# Patient Record
Sex: Female | Born: 1950 | Race: White | Hispanic: No | Marital: Single | Smoking: Current every day smoker
Health system: Southern US, Community
[De-identification: ages and names within clinical notes are randomized; demographics above are authoritative.]

## PROBLEM LIST (undated history)

## (undated) DIAGNOSIS — M1990 Unspecified osteoarthritis, unspecified site: Secondary | ICD-10-CM

## (undated) DIAGNOSIS — E079 Disorder of thyroid, unspecified: Secondary | ICD-10-CM

## (undated) DIAGNOSIS — T7840XA Allergy, unspecified, initial encounter: Secondary | ICD-10-CM

## (undated) DIAGNOSIS — I10 Essential (primary) hypertension: Secondary | ICD-10-CM

## (undated) DIAGNOSIS — G4730 Sleep apnea, unspecified: Secondary | ICD-10-CM

## (undated) HISTORY — DX: Unspecified osteoarthritis, unspecified site: M19.90

## (undated) HISTORY — DX: Sleep apnea, unspecified: G47.30

## (undated) HISTORY — DX: Allergy, unspecified, initial encounter: T78.40XA

## (undated) HISTORY — DX: Disorder of thyroid, unspecified: E07.9

## (undated) HISTORY — PX: DENTAL SURGERY: SHX609

---

## 2005-01-28 ENCOUNTER — Emergency Department: Payer: Self-pay | Admitting: Emergency Medicine

## 2005-03-11 ENCOUNTER — Ambulatory Visit: Payer: Self-pay | Admitting: Orthopaedic Surgery

## 2005-04-29 ENCOUNTER — Ambulatory Visit: Payer: Self-pay | Admitting: Internal Medicine

## 2006-01-08 ENCOUNTER — Ambulatory Visit: Payer: Self-pay | Admitting: Internal Medicine

## 2007-07-21 IMAGING — US US RENAL KIDNEY
1 series · 17 of 20 positions shown · non-contrast
Comparison: none

REASON FOR EXAM: hematuria
COMMENTS:

[Series 1: us renal kidney · 17 of 20 slices shown]
[im 1/20]
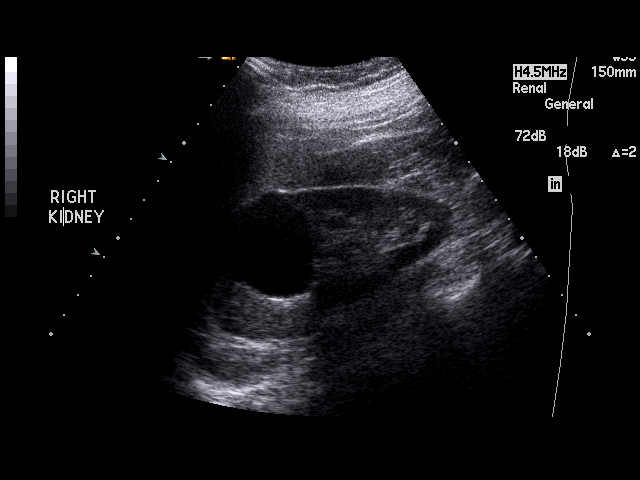
[im 2/20]
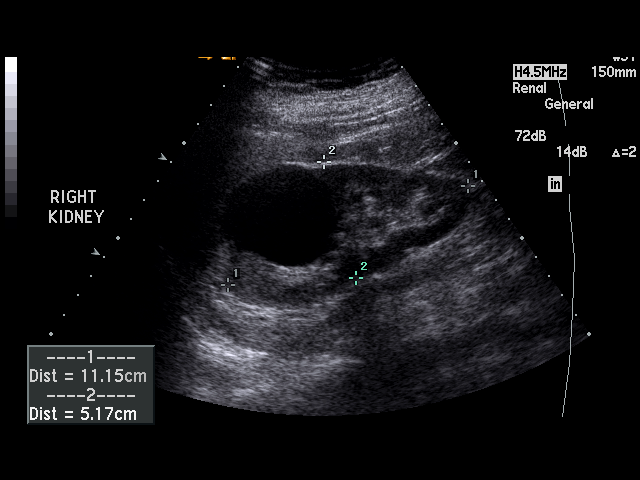
[im 3/20]
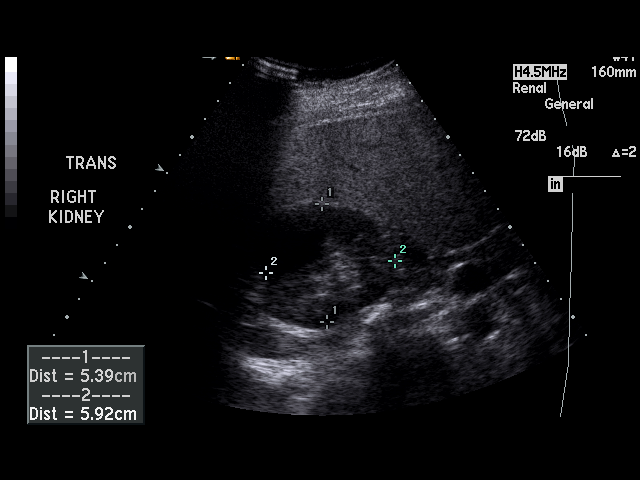
[im 5/20]
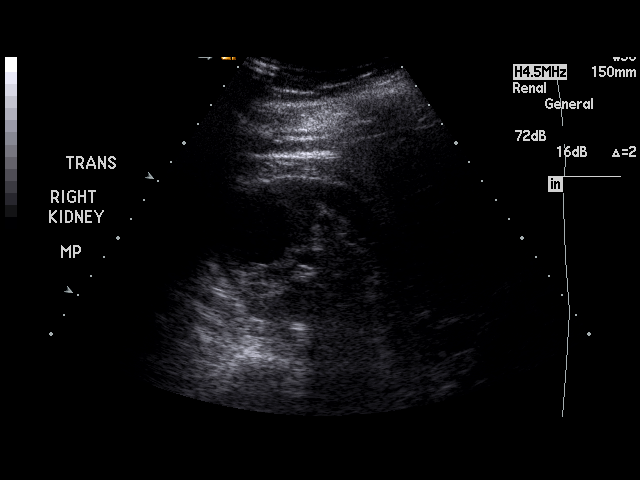
[im 6/20]
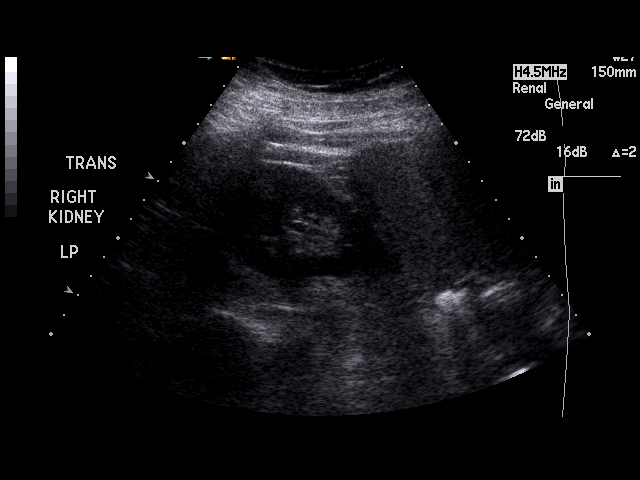
[im 7/20]
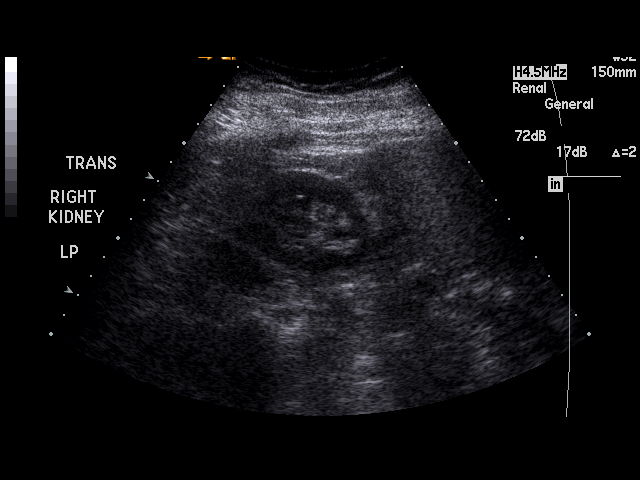
[im 8/20]
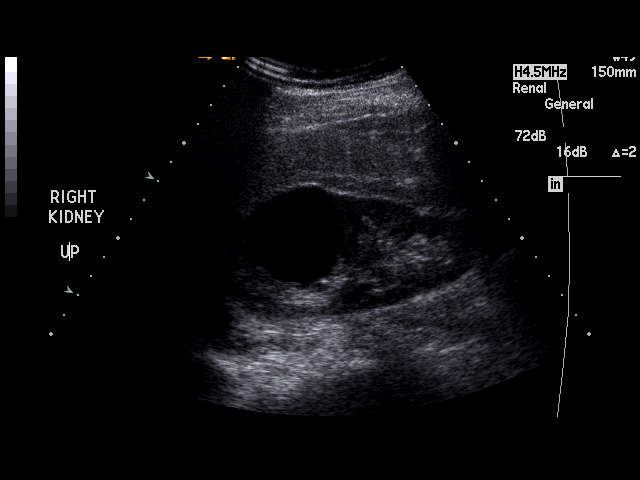
[im 9/20]
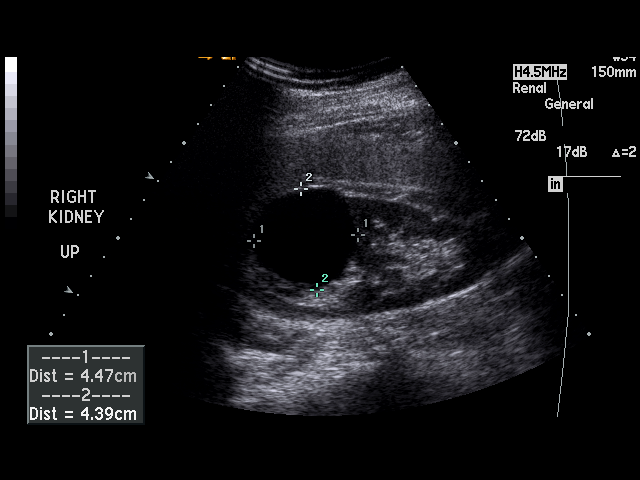
[im 11/20]
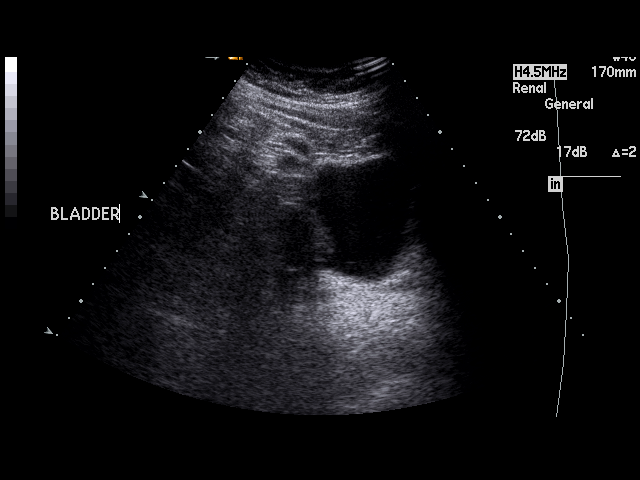
[im 12/20]
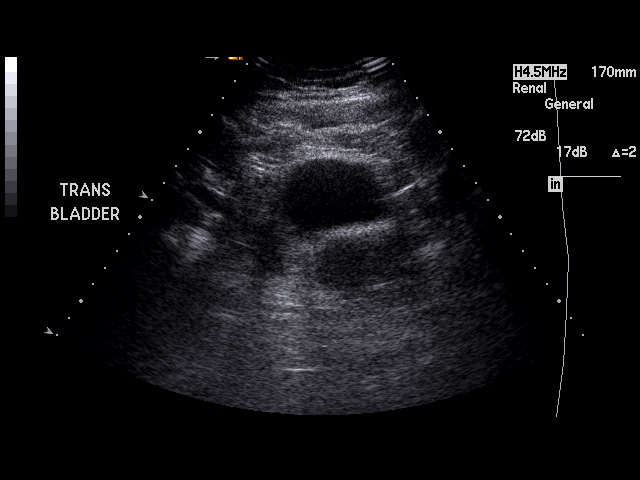
[im 13/20]
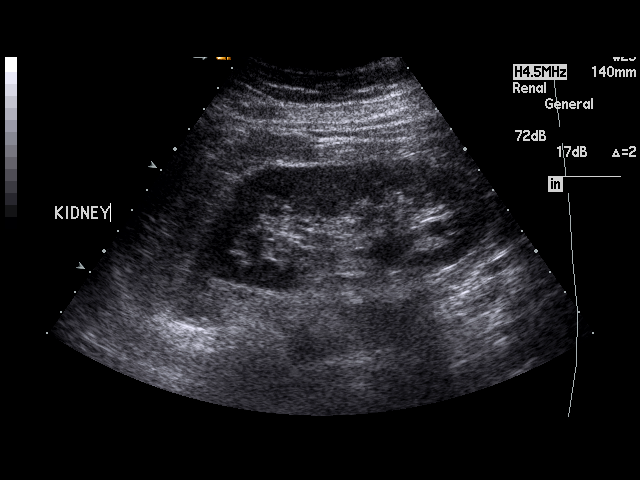
[im 14/20]
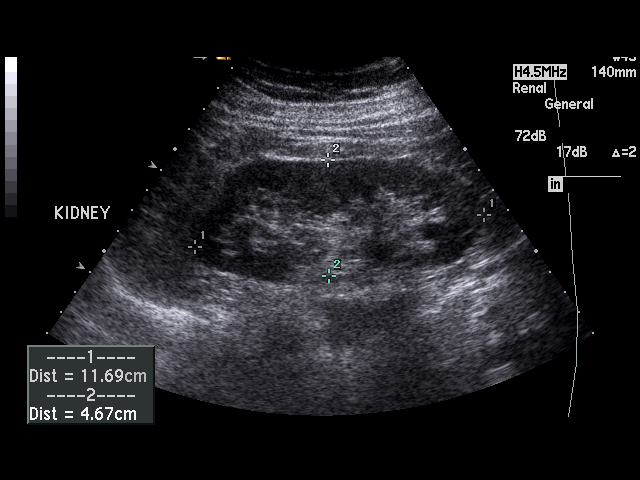
[im 15/20]
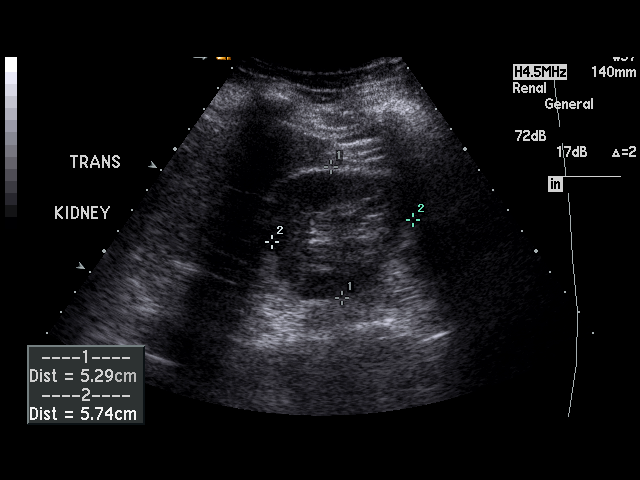
[im 16/20]
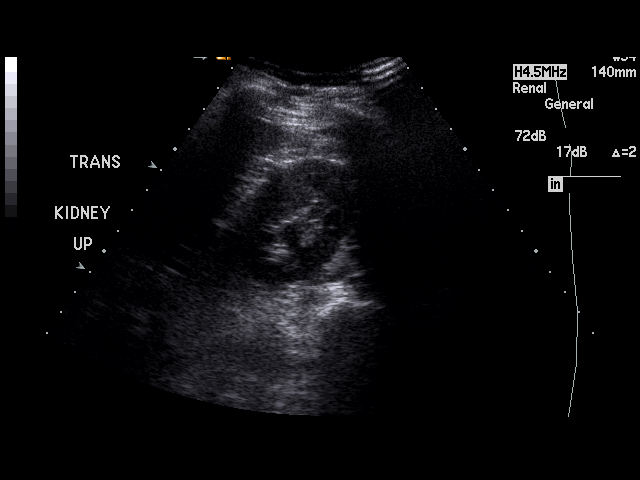
[im 18/20]
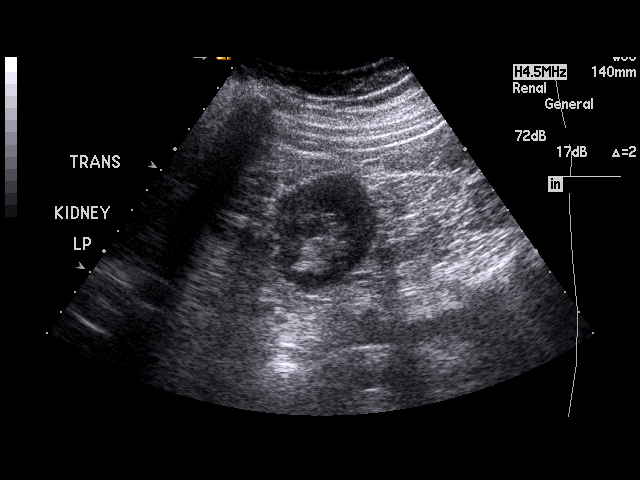
[im 19/20]
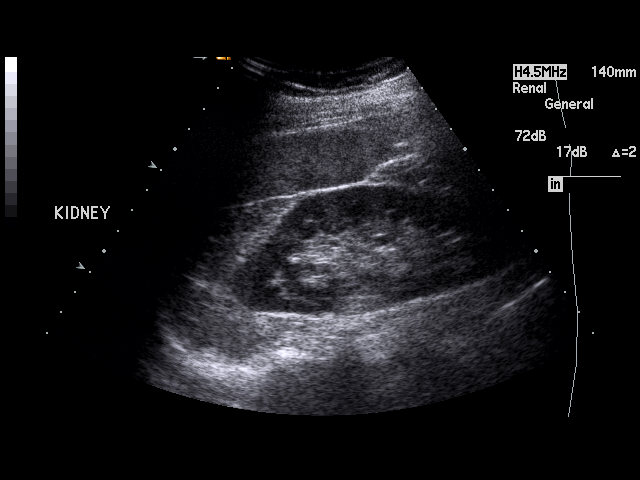
[im 20/20]
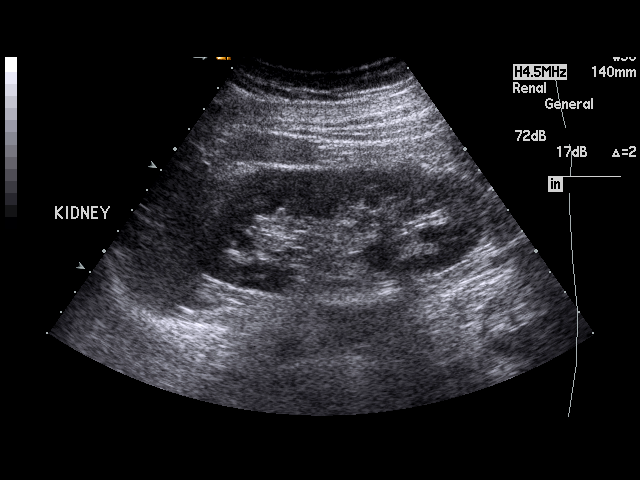

[17 of 20 positions shown; findings below may reference images not displayed]

PROCEDURE:     US  - US KIDNEY BILATERAL  - January 08, 2006  [DATE]

RESULT:     Sonographic evaluation of the kidneys reveals the RIGHT kidney
measures 11.2 x 5.4 x 5.9 cm and the LEFT 11.7 x 5.3 x 5.7 cm.  There is a
cyst in the upper pole of the RIGHT kidney measuring 4.5 x 5.1 x 4.8 cm.  No
hydronephrosis or definite soft tissue mass is seen. The cyst mentioned
above appears to be a simple cyst with no evidence of septation.  There is a
small amount of urine in the bladder.
IMPRESSION: 1)Upper pole RIGHT renal cyst.  Otherwise, unremarkable renal ultrasound.

## 2007-07-21 IMAGING — CR DG IVP HYPERTENSIVE
1 series · 8 of 10 positions shown · non-contrast
Comparison: none

REASON FOR EXAM: Hematuria
COMMENTS:

PROCEDURE:     DXR - DXR INTRAVENOUS UROGRAPHY (IVP)  - January 08, 2006 [DATE]
RESULT:
HISTORY: Hematuria.
PROCEDURE AND FINDINGS:    Standard IVP is obtained.   This was performed
with nonionic contrast.  Bilateral prompt renal function is noted.  The
renal collecting systems and ureters are normal.  The bladder is normal.
Post-void residual is normal.

[Series 1: view not recorded · 0.17mm/px · 8 of 11 slices shown]
[im 1/11]
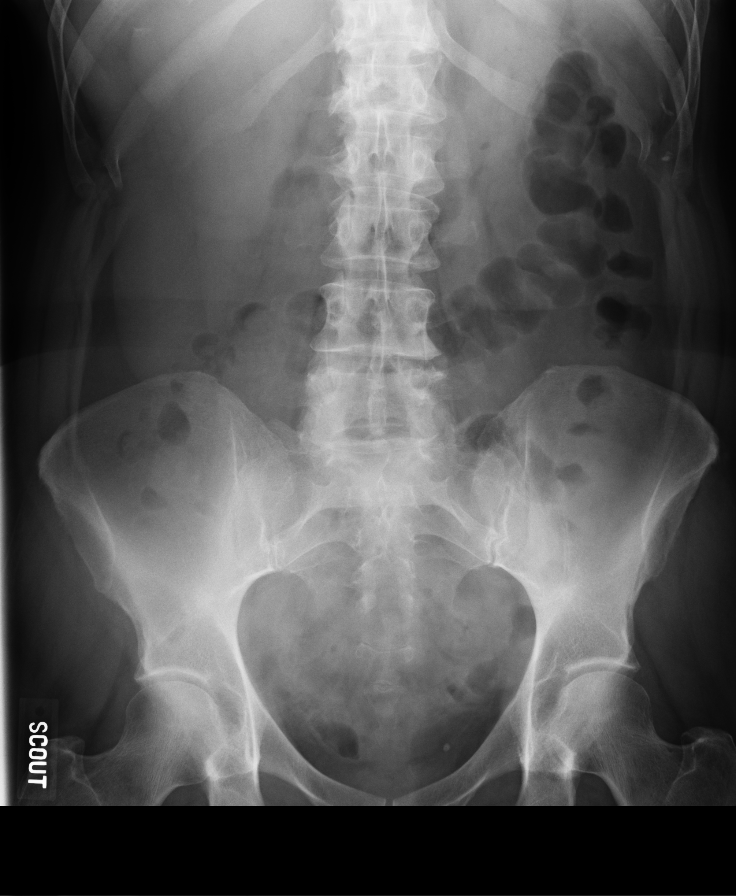
[im 2/11]
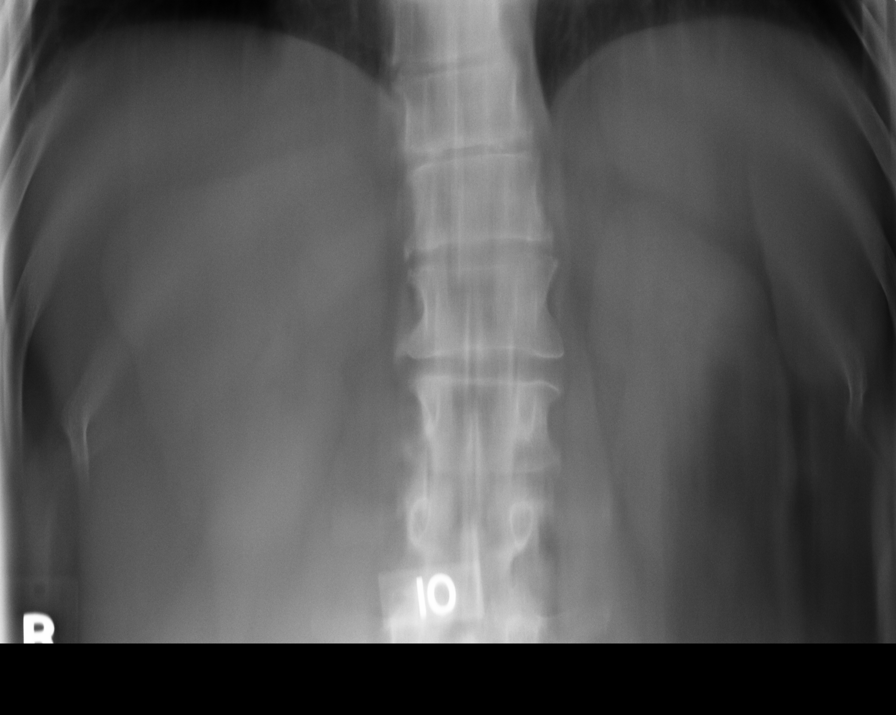
[im 3/11]
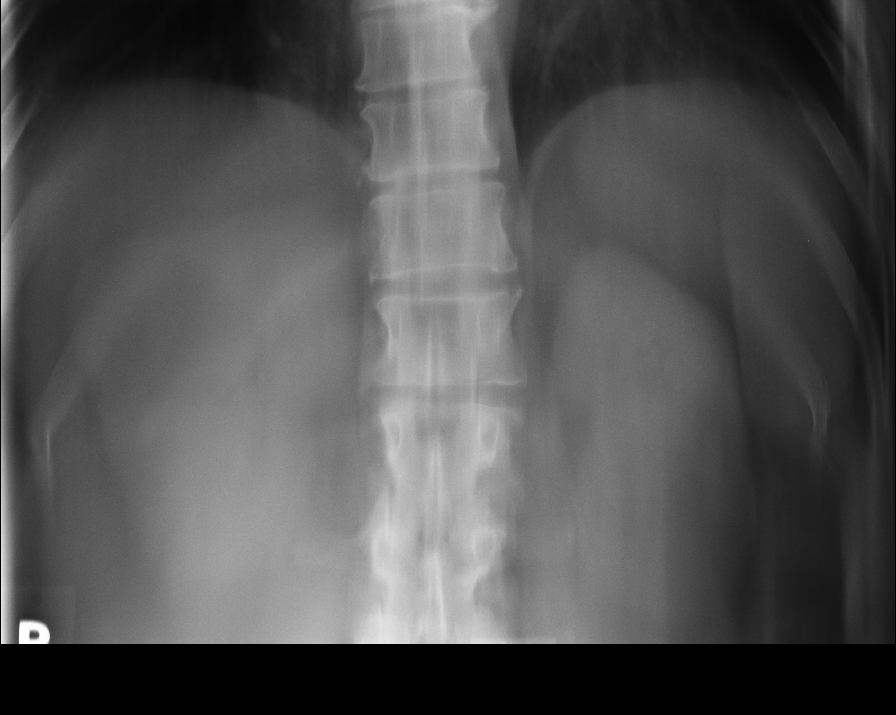
[im 4/11]
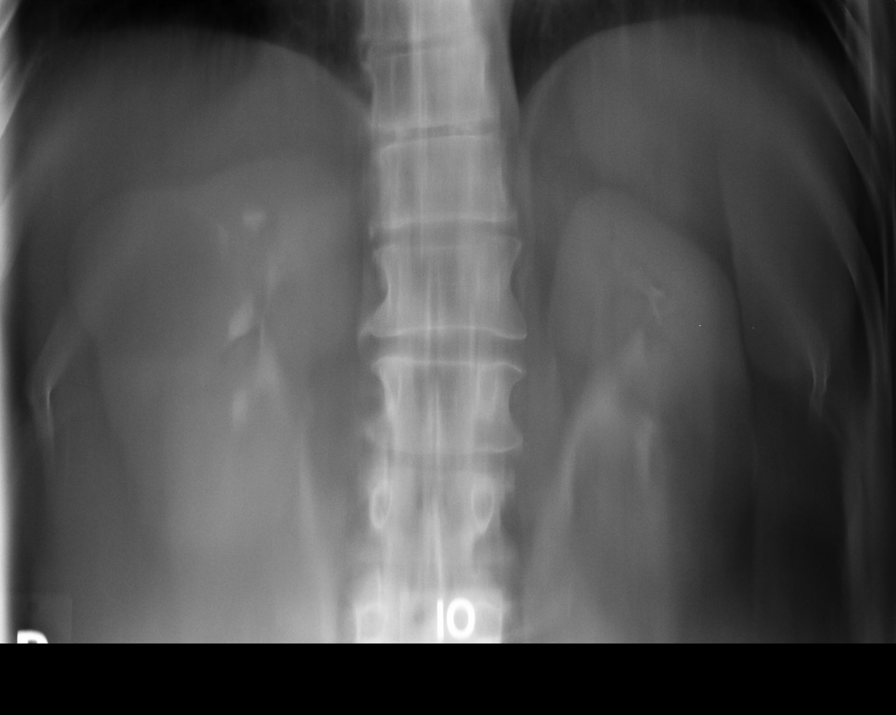
[im 5/11]
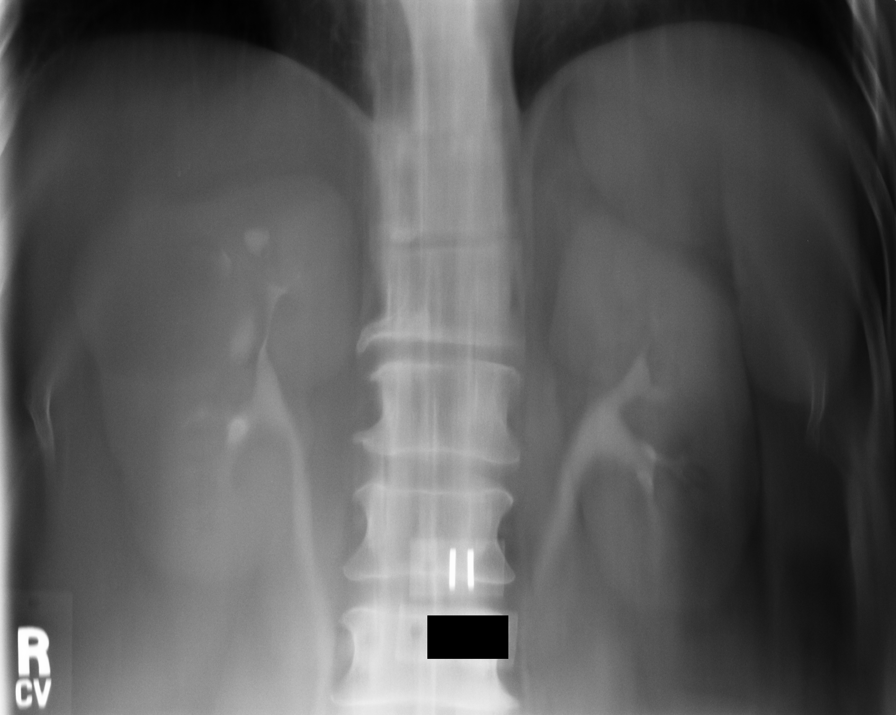
[im 6/11]
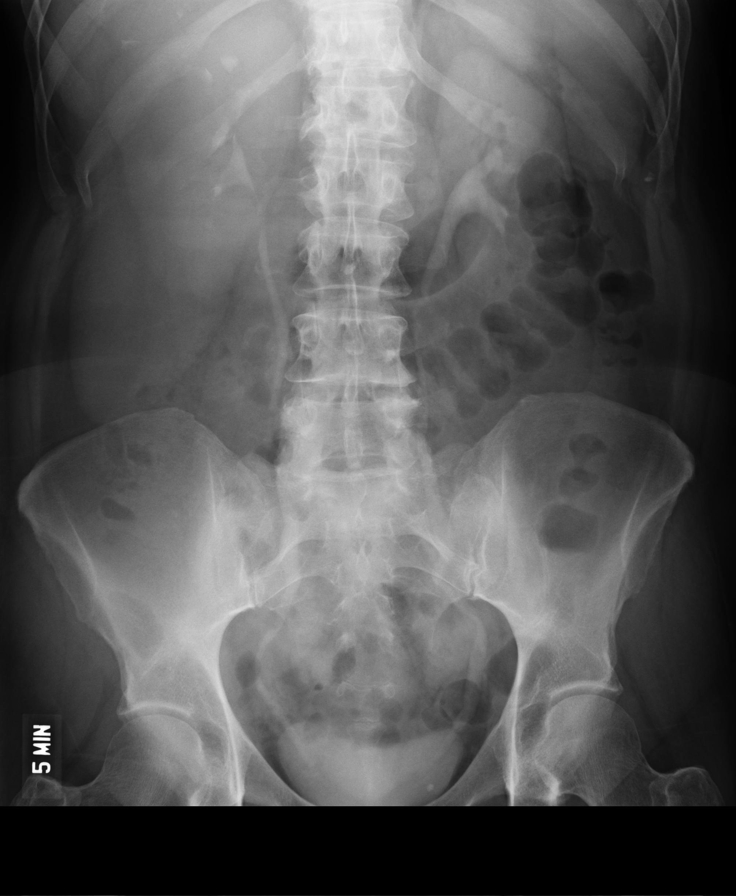
[im 7/11]
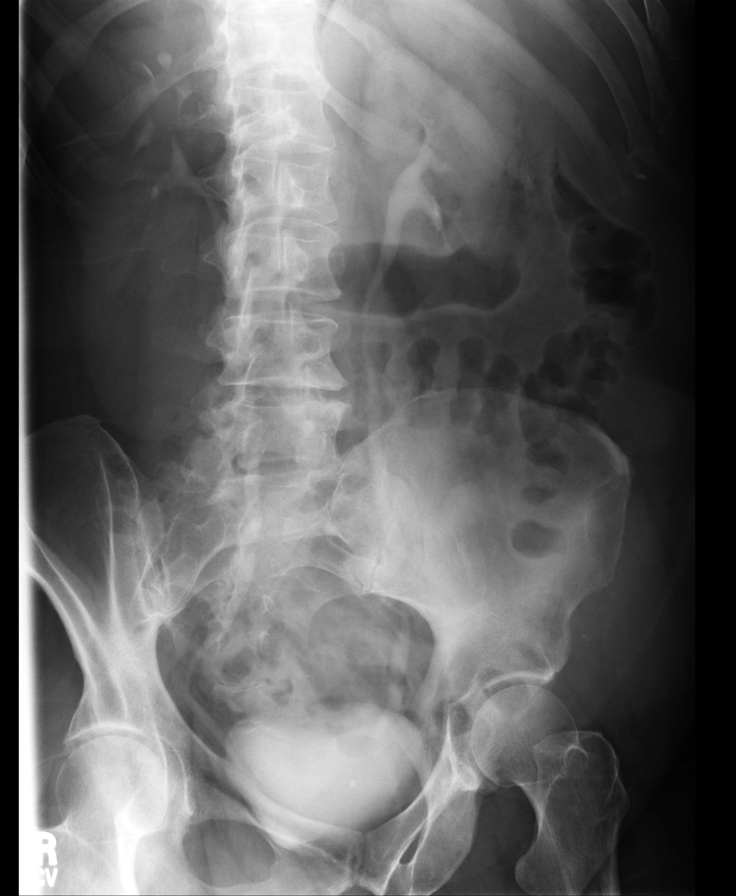
[im 8/11]
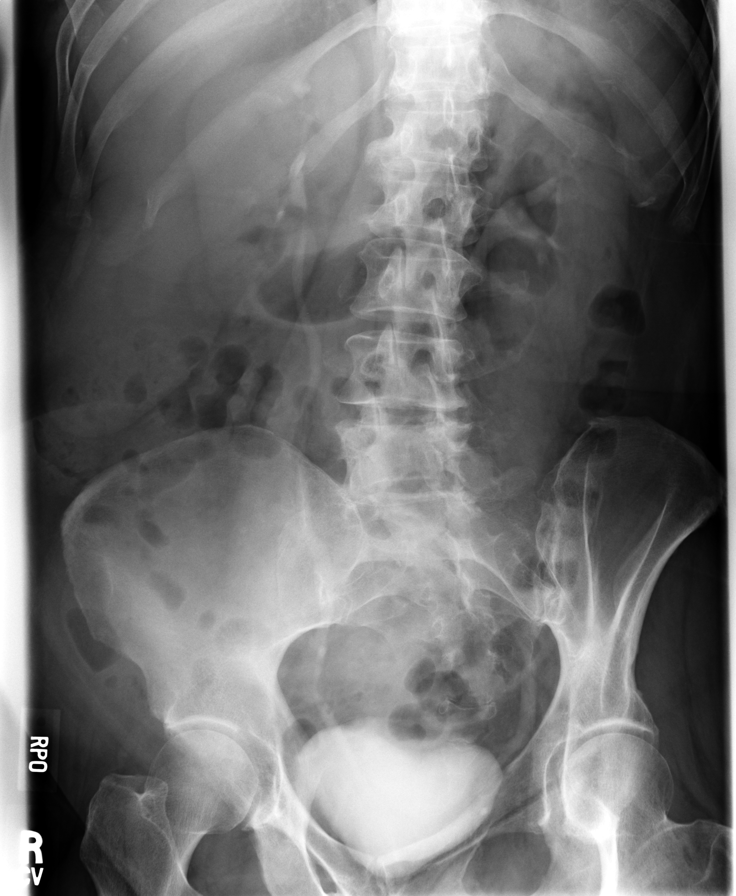

[8 of 10 positions shown; findings below may reference images not displayed]

IMPRESSION: Normal IVP.

## 2018-11-27 ENCOUNTER — Emergency Department
Admission: EM | Admit: 2018-11-27 | Discharge: 2018-11-27 | Disposition: A | Discharge status: Home / Self Care | Payer: Medicare Other | Attending: Emergency Medicine | Admitting: Emergency Medicine

## 2018-11-27 ENCOUNTER — Other Ambulatory Visit: Payer: Self-pay

## 2018-11-27 ENCOUNTER — Encounter: Payer: Self-pay | Admitting: Emergency Medicine

## 2018-11-27 DIAGNOSIS — F17200 Nicotine dependence, unspecified, uncomplicated: Secondary | ICD-10-CM | POA: Insufficient documentation

## 2018-11-27 DIAGNOSIS — I10 Essential (primary) hypertension: Secondary | ICD-10-CM | POA: Insufficient documentation

## 2018-11-27 DIAGNOSIS — Y929 Unspecified place or not applicable: Secondary | ICD-10-CM | POA: Diagnosis not present

## 2018-11-27 DIAGNOSIS — S91312A Laceration without foreign body, left foot, initial encounter: Secondary | ICD-10-CM | POA: Diagnosis present

## 2018-11-27 DIAGNOSIS — W228XXA Striking against or struck by other objects, initial encounter: Secondary | ICD-10-CM | POA: Insufficient documentation

## 2018-11-27 DIAGNOSIS — Z880 Allergy status to penicillin: Secondary | ICD-10-CM | POA: Diagnosis not present

## 2018-11-27 DIAGNOSIS — Y999 Unspecified external cause status: Secondary | ICD-10-CM | POA: Diagnosis not present

## 2018-11-27 DIAGNOSIS — Y9301 Activity, walking, marching and hiking: Secondary | ICD-10-CM | POA: Diagnosis not present

## 2018-11-27 HISTORY — DX: Essential (primary) hypertension: I10

## 2018-11-27 MED ORDER — CLINDAMYCIN HCL 300 MG PO CAPS: 300.0000 mg | ORAL_CAPSULE | Freq: Four times a day (QID) | ORAL | 0 refills | Status: AC

## 2018-11-27 MED ORDER — CLINDAMYCIN HCL 150 MG PO CAPS
300.0000 mg | ORAL_CAPSULE | Freq: Once | ORAL | Status: AC
  Administered 2018-11-27: 300 mg via ORAL
  Filled 2018-11-27: qty 2

## 2018-11-27 NOTE — ED Notes (Signed)
Pt refused crutches. Offered walker instead for ease of movement, pt refused walker as well. Pt insists on walking out herself. Pt discharged in stable condition per EDP Gaines.

## 2018-11-27 NOTE — ED Provider Notes (Signed)
[facility] Provider Note   CSN: [id number] Arrival date & time: 11/27/18  1246     History   Chief Complaint Chief Complaint  Patient presents with  . Laceration    HPI Sharon Shields is a 68 y.o. female.  Presents to the emergency department for evaluation of laceration to the posterior aspect of the left foot along the calcaneus.  She is able to actively ankle plantarflex and dorsiflex.  Laceration occurred 3 PM yesterday.  She is not diabetic.  Tetanus was updated today at urgent care facility.  Patient states she did not want to come to the ER last night so she went to urgent care today and was told to come to the ER for further wound management.  She does not take any medications for pain she is not on any antibiotics.  Patient states laceration occurred from a storm door closing on her left heel.  She states pain is only around the laceration.  She has been weightbearing with a boot.  No numbness or tingling.     HPI  Past Medical History:  Diagnosis Date  . Hypertension     There are no active problems to display for this patient.   Past Surgical History:  Procedure Laterality Date  . DENTAL SURGERY       OB History   No obstetric history on file.      Home Medications    Prior to Admission medications   Medication Sig Start Date End Date Taking? Authorizing Provider  clindamycin (CLEOCIN) 300 MG capsule Take 1 capsule (300 mg total) by mouth 4 (four) times daily for 7 days. 11/27/18 12/04/18  Duanne Guess, PA-C    Family History No family history on file.  Social History Social History   Tobacco Use  . Smoking status: Current Every Day Smoker  . Smokeless tobacco: Never Used  Substance Use Topics  . Alcohol use: Yes  . Drug use: Never     Allergies   Erythromycin and Penicillins   Review of Systems Review of Systems  Constitutional: Negative for fever.  Musculoskeletal: Negative for  arthralgias, gait problem, joint swelling and myalgias.  Skin: Positive for wound.  Neurological: Negative for dizziness and headaches.     Physical Exam Updated Vital Signs BP (!) 165/88 (BP Location: Left Arm)   Pulse 94   Temp 98.6 F (37 C) (Oral)   Resp 18   Ht 5\' 6"  (1.676 m)   Wt 68 kg   SpO2 97%   BMI 24.21 kg/m   Physical Exam Constitutional:      Appearance: She is well-developed.  HENT:     Head: Normocephalic and atraumatic.  Eyes:     Conjunctiva/sclera: Conjunctivae normal.  Neck:     Musculoskeletal: Normal range of motion.  Cardiovascular:     Rate and Rhythm: Normal rate.  Pulmonary:     Effort: Pulmonary effort is normal. No respiratory distress.  Musculoskeletal: Normal range of motion.     Comments: 6 cm transverse laceration to the left heel below the Achilles tendon insertion.  No visible or palpable foreign body.  No gaping laceration.  Wound margins are approximate 0.5 cm separated.  Unable to passively push tissue together to aligned wound margins.  There is no warmth redness or drainage.  Patient is able to actively ankle plantarflex and dorsiflex.  Nontender along the calcaneus.  Skin:    General: Skin is warm.  Findings: No rash.  Neurological:     Mental Status: She is alert and oriented to person, place, and time.  Psychiatric:        Behavior: Behavior normal.        Thought Content: Thought content normal.      ED Treatments / Results  Labs (all labs ordered are listed, but only abnormal results are displayed) Labs Reviewed - No data to display  EKG None  Radiology No results found.  Procedures Procedures (including critical care time)  Medications Ordered in ED Medications  clindamycin (CLEOCIN) capsule 300 mg (has no administration in time range)     Initial Impression / Assessment and Plan / ED Course  I have reviewed the triage vital signs and the nursing notes.  Pertinent labs & imaging results that were  available during my care of the patient were reviewed by me and considered in my medical decision making (see chart for details).        68 year old female with transverse laceration to the left calcaneus that occurred 25 hours prior to arrival.  Patients tetanus is updated.  Wound was thoroughly irrigated with Betadine and saline.  Collagen pad is placed over the laceration site and we were allowed to heal by secondary intention due to greater than 24 hours after arrival from time of injury.  Steri-Strips are applied over the collagen laceration to slightly pull wound together.  A sterile dressing is then placed over the laceration site with Ace wrap.  She will keep clean and dry and stay nonweightbearing.  She is placed on prophylactic antibiotics clindamycin.  She will follow-up with podiatrist next week.  She understands signs and symptoms return to ED for.  Final Clinical Impressions(s) / ED Diagnoses   Final diagnoses:  Foot laceration, left, initial encounter    ED Discharge Orders         Ordered    clindamycin (CLEOCIN) 300 MG capsule  4 times daily     11/27/18 1713           Renata Caprice 11/27/18 1723    Nena Polio, MD 11/27/18 [phone]

## 2018-11-27 NOTE — ED Notes (Signed)

## 2018-11-27 NOTE — Discharge Instructions (Addendum)
Please avoid bearing weight on the left lower extremity.  Use crutches as needed for walking.  Please keep laceration site clean and dry.  Change dressing every 1 to 2 days as needed.  Call podiatrist tomorrow morning to schedule follow-up appointment.  Return to the ER for any increasing pain swelling warmth or redness.

## 2018-11-27 NOTE — ED Triage Notes (Signed)
Pt presents to ED via POV from [facility]. Pt states yesterday storm door slammed on back of heel, pt states approx 6cm laceration that is U shaped to back of heel, pt states applied dressing to back of heel at home, received tetanus shot at [facility] and was sent to ED. Bleeding controlled by bandage at this time.

## 2018-11-28 ENCOUNTER — Telehealth: Payer: Self-pay | Admitting: Emergency Medicine

## 2018-11-28 NOTE — Telephone Encounter (Signed)
Pt calling, did not receive RX , called RX to [address] , [phone]  Clindamycin 300 mg cap , take 1 cap 4 times daily, for 7 days

## 2020-05-23 ENCOUNTER — Encounter: Payer: Self-pay | Admitting: Gastroenterology

## 2020-06-03 ENCOUNTER — Other Ambulatory Visit: Payer: Self-pay

## 2020-06-03 ENCOUNTER — Ambulatory Visit (AMBULATORY_SURGERY_CENTER): Payer: Medicare Other

## 2020-06-03 VITALS — Ht 66.0 in | Wt 143.0 lb

## 2020-06-03 DIAGNOSIS — Z1211 Encounter for screening for malignant neoplasm of colon: Secondary | ICD-10-CM

## 2020-06-03 NOTE — Progress Notes (Signed)
Pre visit completed via phone call; patient verified name, DOB, and address;  No egg or soy allergy known to patient  No issues with past sedation with any surgeries or procedures No intubation problems in the past - no past surgeries with general anesthesia No FH of Malignant Hyperthermia No diet pills per patient No home 02 use per patient  No blood thinners per patient  Pt denies issues with constipation  No A fib or A flutter  COVID 19 guidelines implemented in PV today with Pt and RN  Pt is fully vaccinated  for Covid x 2;  Coupon given to pt in PV today , Code to Pharmacy  Due to the COVID-19 pandemic we are asking patients to follow certain guidelines.  Pt aware of COVID protocols and [facility] guidelines

## 2020-06-17 ENCOUNTER — Ambulatory Visit (AMBULATORY_SURGERY_CENTER): Payer: Medicare Other | Admitting: Gastroenterology

## 2020-06-17 ENCOUNTER — Encounter: Payer: Self-pay | Admitting: Gastroenterology

## 2020-06-17 ENCOUNTER — Other Ambulatory Visit: Payer: Self-pay

## 2020-06-17 VITALS — BP 148/71 | HR 66 | Temp 98.9°F | Resp 13 | Ht 66.0 in | Wt 143.0 lb

## 2020-06-17 DIAGNOSIS — K635 Polyp of colon: Secondary | ICD-10-CM | POA: Diagnosis not present

## 2020-06-17 DIAGNOSIS — R195 Other fecal abnormalities: Secondary | ICD-10-CM | POA: Diagnosis not present

## 2020-06-17 DIAGNOSIS — D120 Benign neoplasm of cecum: Secondary | ICD-10-CM

## 2020-06-17 DIAGNOSIS — K5730 Diverticulosis of large intestine without perforation or abscess without bleeding: Secondary | ICD-10-CM

## 2020-06-17 DIAGNOSIS — Z1211 Encounter for screening for malignant neoplasm of colon: Secondary | ICD-10-CM

## 2020-06-17 DIAGNOSIS — D122 Benign neoplasm of ascending colon: Secondary | ICD-10-CM | POA: Diagnosis not present

## 2020-06-17 DIAGNOSIS — D123 Benign neoplasm of transverse colon: Secondary | ICD-10-CM

## 2020-06-17 DIAGNOSIS — K648 Other hemorrhoids: Secondary | ICD-10-CM

## 2020-06-17 MED ORDER — SODIUM CHLORIDE 0.9 % IV SOLN: 500.0000 mL | Freq: Once | INTRAVENOUS | Status: DC

## 2020-06-17 NOTE — Patient Instructions (Signed)
Handouts given for polyps, diverticulosis, and high fiber diet.    YOU HAD AN ENDOSCOPIC PROCEDURE TODAY AT [facility]:   Refer to the procedure report that was given to you for any specific questions about what was found during the examination.  If the procedure report does not answer your questions, please call your gastroenterologist to clarify.  If you requested that your care partner not be given the details of your procedure findings, then the procedure report has been included in a sealed envelope for you to review at your convenience later.  YOU SHOULD EXPECT: Some feelings of bloating in the abdomen. Passage of more gas than usual.  Walking can help get rid of the air that was put into your GI tract during the procedure and reduce the bloating. If you had a lower endoscopy (such as a colonoscopy or flexible sigmoidoscopy) you may notice spotting of blood in your stool or on the toilet paper. If you underwent a bowel prep for your procedure, you may not have a normal bowel movement for a few days.  Please Note:  You might notice some irritation and congestion in your nose or some drainage.  This is from the oxygen used during your procedure.  There is no need for concern and it should clear up in a day or so.  SYMPTOMS TO REPORT IMMEDIATELY:   Following lower endoscopy (colonoscopy):  Excessive amounts of blood in the stool  Significant tenderness or worsening of abdominal pains  Swelling of the abdomen that is new, acute  Fever of 100F or higher  For urgent or emergent issues, a gastroenterologist can be reached at any hour by calling [phone]. Do not use MyChart messaging for urgent concerns.    DIET:  We do recommend a small meal at first, but then you may proceed to your regular diet.  Drink plenty of fluids but you should avoid alcoholic beverages for 24 hours.  ACTIVITY:  You should plan to take it easy for the rest of today and you should NOT DRIVE, no  work or use heavy machinery until tomorrow (because of the sedation medicines used during the test).    FOLLOW UP: Our staff will call the number listed on your records Wednesday 2/9 at 715-8 am following your procedure to check on you and address any questions or concerns that you may have regarding the information given to you following your procedure. If we do not reach you, we will leave a message.  We will attempt to reach you two times.  During this call, we will ask if you have developed any symptoms of COVID 19. If you develop any symptoms (ie: fever, flu-like symptoms, shortness of breath, cough etc.) before then, please call [phone].  If you test positive for Covid 19 in the 2 weeks post procedure, please call and report this information to [place].    If any biopsies were taken you will be contacted by phone or by letter within the next 1-3 weeks.  Please call us at [phone] if you have not heard about the biopsies in 3 weeks.    SIGNATURES/CONFIDENTIALITY: You and/or your care partner have signed paperwork which will be entered into your electronic medical record.  These signatures attest to the fact that that the information above on your After Visit Summary has been reviewed and is understood.  Full responsibility of the confidentiality of this discharge information lies with you and/or your care-partner.

## 2020-06-17 NOTE — Op Note (Signed)
[facility] Patient Name: Sharon Shields Procedure Date: 06/17/2020 11:06 AM MRN: [id number] Endoscopist: Milus Banister , MD Age: 70 Referring MD:  Date of Birth: [birth year] Gender: Female Account #: [id number] Procedure:                Colonoscopy Indications:              Positive Cologuard test Medicines:                Monitored Anesthesia Care Procedure:                Pre-Anesthesia Assessment:                           - Prior to the procedure, a History and Physical                            was performed, and patient medications and                            allergies were reviewed. The patient's tolerance of                            previous anesthesia was also reviewed. The risks                            and benefits of the procedure and the sedation                            options and risks were discussed with the patient.                            All questions were answered, and informed consent                            was obtained. Prior Anticoagulants: The patient has                            taken no previous anticoagulant or antiplatelet                            agents. ASA Grade Assessment: II - A patient with                            mild systemic disease. After reviewing the risks                            and benefits, the patient was deemed in                            satisfactory condition to undergo the procedure.                           After obtaining informed consent, the colonoscope  was passed under direct vision. Throughout the                            procedure, the patient's blood pressure, pulse, and                            oxygen saturations were monitored continuously. The                            Olympus CF-HQ190L (Serial# [id number]) Colonoscope was                            introduced through the anus and advanced to the the                            cecum, identified by appendiceal  orifice and                            ileocecal valve. The colonoscopy was performed                            without difficulty. The patient tolerated the                            procedure well. The quality of the bowel                            preparation was good. The ileocecal valve,                            appendiceal orifice, and rectum were photographed. Scope In: 11:20:06 AM Scope Out: 11:47:14 AM Scope Withdrawal Time: 0 hours 23 minutes 0 seconds  Total Procedure Duration: 0 hours 27 minutes 8 seconds  Findings:                 A 5 mm polyp was found in the cecum. The polyp was                            sessile. The polyp was removed with a cold snare.                            Resection and retrieval were complete. jar 1                           A 15 mm polyp was found in the cecum. The polyp was                            sessile. The polyp was removed with a piecemeal                            technique using a hot snare. Resection and  retrieval were complete. jar 2                           A 30 mm polyp was found in the cecum. The polyp was                            sessile. Biopsies were taken with a cold forceps                            for histology. jar 3                           A 7 mm polyp was found in the ascending colon. The                            polyp was sessile. The polyp was removed with a                            cold snare. Resection and retrieval were complete.                            jar 4                           Several other medium sized polyps were found in the                            transverse segment and at the splenic flexure (5-7                            more, 2-3cm sessile polyps).                           Multiple small and large-mouthed diverticula were                            found in the left colon.                           External and internal hemorrhoids were found. The                             hemorrhoids were small. Complications:            No immediate complications. Estimated blood loss:                            None. Estimated Blood Loss:     Estimated blood loss: none. Impression:               - One 5 mm polyp in the cecum, removed with a cold                            snare. Resected and retrieved.                           -  One 15 mm polyp in the cecum, removed piecemeal                            using a hot snare. Resected and retrieved.                           - One 30 mm polyp in the cecum. Biopsied to check                            for advanced changes, malignancy.                           - One 7 mm polyp in the ascending colon, removed                            with a cold snare. Resected and retrieved.                           - Several other medium sized polyps were found in                            the transverse segment and at the splenic flexure                            (5-7 more, 2-3cm sessile polyps). None were removed                            or sampled. These will require further attention                            (repeat colonoscopy in hospital given higher risk                            nature).                           - Diverticulosis in the left colon.                           - External and internal hemorrhoids. Recommendation:           - Patient has a contact number available for                            emergencies. The signs and symptoms of potential                            delayed complications were discussed with the                            patient. Return to normal activities tomorrow.                            Written discharge instructions were provided to the  patient.                           - Resume previous diet.                           - Continue present medications.                           - Await pathology results. Milus Banister, MD 06/17/2020  12:03:13 PM This report has been signed electronically.

## 2020-06-17 NOTE — Progress Notes (Signed)
Called to room to assist during endoscopic procedure.  Patient ID and intended procedure confirmed with present staff. Received instructions for my participation in the procedure from the performing physician.  

## 2020-06-17 NOTE — Progress Notes (Signed)
PT taken to PACU. Monitors in place. VSS. Report given to RN. 

## 2020-06-17 NOTE — Progress Notes (Signed)
VS by CW. ?

## 2020-06-19 ENCOUNTER — Telehealth: Payer: Self-pay

## 2020-06-19 NOTE — Telephone Encounter (Signed)
Second post procedure follow up call, no answer 

## 2020-06-19 NOTE — Telephone Encounter (Signed)
First post procedure follow up call, no answer 

## 2020-06-21 NOTE — Telephone Encounter (Signed)
The pt has been advised that we will contact her as soon as the results have been received.  The pt was also advised that RN tried to reach her a couple times.  The pt states she had no missed calls.

## 2020-06-21 NOTE — Telephone Encounter (Signed)
Patient just called and stated she has not received any phones calls from our office to follow up wit her after procedure. Is requesting a call back for results

## 2020-06-24 ENCOUNTER — Telehealth: Payer: Self-pay | Admitting: Gastroenterology

## 2020-06-24 NOTE — Telephone Encounter (Signed)
Pt is requesting a call back from a nurse regarding her lab results. 

## 2020-06-24 NOTE — Telephone Encounter (Signed)
The pt has been advised that Dr Ardis Hughs has not reviewed the result and as soon as available she will be contacted.

## 2020-06-25 NOTE — Telephone Encounter (Signed)
Pt is requesting a call back from a nurse regarding her pathology report

## 2020-06-26 NOTE — Telephone Encounter (Signed)
Dr Ardis Hughs have you reviewed the pathology results for this pt?  She is very upset that she has not been contacted.

## 2020-06-26 NOTE — Telephone Encounter (Signed)
Pt is very upset because nobody has contacted her regarding her pathology, pt would like a call back with an update.

## 2020-06-27 ENCOUNTER — Other Ambulatory Visit: Payer: Self-pay

## 2020-06-27 DIAGNOSIS — Z86010 Personal history of colonic polyps: Secondary | ICD-10-CM

## 2020-08-02 NOTE — Progress Notes (Signed)
Attempted to obtain medical history via telephone, unable to reach at this time. I left a voicemail to return pre surgical testing department's phone call.  

## 2020-08-05 ENCOUNTER — Other Ambulatory Visit (HOSPITAL_COMMUNITY)
Admission: RE | Admit: 2020-08-05 | Discharge: 2020-08-05 | Disposition: A | Discharge status: Home / Self Care | Payer: Medicare Other | Source: Ambulatory Visit | Attending: Gastroenterology | Admitting: Gastroenterology

## 2020-08-05 DIAGNOSIS — Z20822 Contact with and (suspected) exposure to covid-19: Secondary | ICD-10-CM | POA: Diagnosis not present

## 2020-08-05 DIAGNOSIS — Z01812 Encounter for preprocedural laboratory examination: Secondary | ICD-10-CM | POA: Diagnosis present

## 2020-08-05 LAB — SARS CORONAVIRUS 2 (TAT 6-24 HRS): SARS Coronavirus 2: NEGATIVE

## 2020-08-08 ENCOUNTER — Encounter (HOSPITAL_COMMUNITY)
Admission: RE | Disposition: A | Discharge status: Home / Self Care | Payer: Self-pay | Source: Home / Self Care | Attending: Gastroenterology

## 2020-08-08 ENCOUNTER — Ambulatory Visit (HOSPITAL_COMMUNITY): Payer: Medicare Other | Admitting: Anesthesiology

## 2020-08-08 ENCOUNTER — Encounter (HOSPITAL_COMMUNITY): Payer: Self-pay | Admitting: Gastroenterology

## 2020-08-08 ENCOUNTER — Ambulatory Visit (HOSPITAL_COMMUNITY)
Admission: RE | Admit: 2020-08-08 | Discharge: 2020-08-08 | Disposition: A | Discharge status: Home / Self Care | Payer: Medicare Other | Attending: Gastroenterology | Admitting: Gastroenterology

## 2020-08-08 ENCOUNTER — Other Ambulatory Visit: Payer: Self-pay

## 2020-08-08 DIAGNOSIS — Z86010 Personal history of colonic polyps: Secondary | ICD-10-CM

## 2020-08-08 DIAGNOSIS — Z881 Allergy status to other antibiotic agents status: Secondary | ICD-10-CM | POA: Diagnosis not present

## 2020-08-08 DIAGNOSIS — D123 Benign neoplasm of transverse colon: Secondary | ICD-10-CM | POA: Diagnosis not present

## 2020-08-08 DIAGNOSIS — K5730 Diverticulosis of large intestine without perforation or abscess without bleeding: Secondary | ICD-10-CM | POA: Diagnosis not present

## 2020-08-08 DIAGNOSIS — Z09 Encounter for follow-up examination after completed treatment for conditions other than malignant neoplasm: Secondary | ICD-10-CM | POA: Diagnosis present

## 2020-08-08 DIAGNOSIS — K635 Polyp of colon: Secondary | ICD-10-CM

## 2020-08-08 DIAGNOSIS — Z880 Allergy status to penicillin: Secondary | ICD-10-CM | POA: Insufficient documentation

## 2020-08-08 DIAGNOSIS — F17210 Nicotine dependence, cigarettes, uncomplicated: Secondary | ICD-10-CM | POA: Insufficient documentation

## 2020-08-08 DIAGNOSIS — D120 Benign neoplasm of cecum: Secondary | ICD-10-CM | POA: Insufficient documentation

## 2020-08-08 HISTORY — PX: SUBMUCOSAL LIFTING INJECTION: SHX6855

## 2020-08-08 HISTORY — PX: POLYPECTOMY: SHX5525

## 2020-08-08 HISTORY — PX: COLONOSCOPY WITH PROPOFOL: SHX5780

## 2020-08-08 SURGERY — COLONOSCOPY WITH PROPOFOL
Anesthesia: Monitor Anesthesia Care

## 2020-08-08 MED ORDER — LIDOCAINE HCL (CARDIAC) PF 100 MG/5ML IV SOSY
PREFILLED_SYRINGE | INTRAVENOUS | Status: DC | PRN
  Administered 2020-08-08: 100 mg via INTRAVENOUS

## 2020-08-08 MED ORDER — GLUCAGON HCL RDNA (DIAGNOSTIC) 1 MG IJ SOLR
INTRAMUSCULAR | Status: DC | PRN
  Administered 2020-08-08: .5 mg via INTRAVENOUS

## 2020-08-08 MED ORDER — GLUCAGON HCL RDNA (DIAGNOSTIC) 1 MG IJ SOLR
INTRAMUSCULAR | Status: AC
  Filled 2020-08-08: qty 1

## 2020-08-08 MED ORDER — PROPOFOL 10 MG/ML IV BOLUS
INTRAVENOUS | Status: DC | PRN
  Administered 2020-08-08: 20 mg via INTRAVENOUS
  Administered 2020-08-08: 10 mg via INTRAVENOUS
  Administered 2020-08-08: 40 mg via INTRAVENOUS
  Administered 2020-08-08 (×2): 20 mg via INTRAVENOUS
  Administered 2020-08-08: 40 mg via INTRAVENOUS

## 2020-08-08 MED ORDER — LACTATED RINGERS IV SOLN: INTRAVENOUS | Status: DC

## 2020-08-08 MED ORDER — PROPOFOL 500 MG/50ML IV EMUL
INTRAVENOUS | Status: DC | PRN
  Administered 2020-08-08: 120 ug/kg/min via INTRAVENOUS

## 2020-08-08 MED ORDER — PROPOFOL 1000 MG/100ML IV EMUL
INTRAVENOUS | Status: AC
  Filled 2020-08-08: qty 200

## 2020-08-08 SURGICAL SUPPLY — 21 items

## 2020-08-08 NOTE — Anesthesia Preprocedure Evaluation (Signed)
Anesthesia Evaluation  Patient identified by MRN, date of birth, ID band Patient awake    Reviewed: Allergy & Precautions, NPO status , Patient's Chart, lab work & pertinent test results  Airway Mallampati: II  TM Distance: >3 FB Neck ROM: Full    Dental no notable dental hx.    Pulmonary sleep apnea and Continuous Positive Airway Pressure Ventilation , Current Smoker,    Pulmonary exam normal breath sounds clear to auscultation       Cardiovascular hypertension, negative cardio ROS Normal cardiovascular exam Rhythm:Regular Rate:Normal     Neuro/Psych negative neurological ROS  negative psych ROS   GI/Hepatic negative GI ROS, (+)     substance abuse  alcohol use,   Endo/Other  negative endocrine ROS  Renal/GU negative Renal ROS  negative genitourinary   Musculoskeletal negative musculoskeletal ROS (+)   Abdominal   Peds negative pediatric ROS (+)  Hematology negative hematology ROS (+)   Anesthesia Other Findings   Reproductive/Obstetrics negative OB ROS                             Anesthesia Physical Anesthesia Plan  ASA: II  Anesthesia Plan: MAC   Post-op Pain Management:    Induction: Intravenous  PONV Risk Score and Plan:   Airway Management Planned: Simple Face Mask  Additional Equipment:   Intra-op Plan:   Post-operative Plan:   Informed Consent: I have reviewed the patients History and Physical, chart, labs and discussed the procedure including the risks, benefits and alternatives for the proposed anesthesia with the patient or authorized representative who has indicated his/her understanding and acceptance.     Dental advisory given  Plan Discussed with: CRNA and Surgeon  Anesthesia Plan Comments:         Anesthesia Quick Evaluation

## 2020-08-08 NOTE — Transfer of Care (Signed)
Immediate Anesthesia Transfer of Care Note  Patient: Sharon Shields  Procedure(s) Performed: COLONOSCOPY WITH PROPOFOL (N/A ) POLYPECTOMY SUBMUCOSAL LIFTING INJECTION  Patient Location: Endoscopy Unit  Anesthesia Type:MAC  Level of Consciousness: awake, alert , oriented and patient cooperative  Airway & Oxygen Therapy: Patient Spontanous Breathing and Patient connected to face mask oxygen  Post-op Assessment: Report given to RN and Post -op Vital signs reviewed and stable  Post vital signs: Reviewed and stable  Last Vitals:  Vitals Value Taken Time  BP 117/64 0846  Temp    Pulse 76 08/08/20 0846  Resp 20 08/08/20 0846  SpO2 100 % 08/08/20 0846  Vitals shown include unvalidated device data.  Last Pain:  Vitals:   08/08/20 0728  TempSrc: Oral  PainSc: 0-No pain         Complications: No complications documented.

## 2020-08-08 NOTE — H&P (Signed)
HPI: This is a woman with multiple medium and larger polyps in her colon last month    ROS: complete GI ROS as described in HPI, all other review negative.  Constitutional:  No unintentional weight loss   Past Medical History:  Diagnosis Date  . Allergy    seasonal allergies  . Arthritis    fingers/toes  . Hypertension    on meds  . Sleep apnea    uses CPAP  . Thyroid disease    on meds    Past Surgical History:  Procedure Laterality Date  . DENTAL SURGERY      No current facility-administered medications for this encounter.    Allergies as of 06/27/2020 - Review Complete 06/17/2020  Allergen Reaction Noted  . Erythromycin Other (See Comments) 11/27/2018  . Penicillins Other (See Comments) 11/27/2018    Family History  Problem Relation Age of Onset  . Colon cancer Mother 43  . Colon polyps Mother 60  . Cervical cancer Mother   . Colon cancer Father 38  . Colon polyps Father 52  . Esophageal cancer Neg Hx   . Rectal cancer Neg Hx   . Stomach cancer Neg Hx     Social History   Socioeconomic History  . Marital status: Single    Spouse name: Not on file  . Number of children: Not on file  . Years of education: Not on file  . Highest education level: Not on file  Occupational History  . Not on file  Tobacco Use  . Smoking status: Current Every Day Smoker    Packs/day: 1.00    Types: Cigarettes  . Smokeless tobacco: Never Used  Vaping Use  . Vaping Use: Never used  Substance and Sexual Activity  . Alcohol use: Yes    Alcohol/week: 14.0 standard drinks    Types: 14 Standard drinks or equivalent per week  . Drug use: Never  . Sexual activity: Not on file  Other Topics Concern  . Not on file  Social History Narrative  . Not on file   Social Determinants of Health   Financial Resource Strain: Not on file  Food Insecurity: Not on file  Transportation Needs: Not on file  Physical Activity: Not on file  Stress: Not on file  Social Connections:  Not on file  Intimate Partner Violence: Not on file     Physical Exam: There were no vitals taken for this visit. Constitutional: generally well-appearing Psychiatric: alert and oriented x3 Abdomen: soft, nontender, nondistended, no obvious ascites, no peritoneal signs, normal bowel sounds No peripheral edema noted in lower extremities  Assessment and plan: 70 y.o. female with muoltiple colon polyps  For repeat colonoscopy today  Please see the "Patient Instructions" section for addition details about the plan.  Owens Loffler, MD [facility] 08/08/2020, 7:12 AM

## 2020-08-08 NOTE — Discharge Instructions (Signed)
YOU HAD AN ENDOSCOPIC PROCEDURE TODAY AT THE [facility]:   Refer to the procedure report that was given to you for any specific questions about what was found during the examination.  If the procedure report does not answer your questions, please call your gastroenterologist to clarify.  If you requested that your care partner not be given the details of your procedure findings, then the procedure report has been included in a sealed envelope for you to review at your convenience later.  YOU SHOULD EXPECT: Some feelings of bloating in the abdomen. Passage of more gas than usual.  Walking can help get rid of the air that was put into your GI tract during the procedure and reduce the bloating. If you had a lower endoscopy (such as a colonoscopy or flexible sigmoidoscopy) you may notice spotting of blood in your stool or on the toilet paper. If you underwent a bowel prep for your procedure, you may not have a normal bowel movement for a few days.  Please Note:  You might notice some irritation and congestion in your nose or some drainage.  This is from the oxygen used during your procedure.  There is no need for concern and it should clear up in a day or so.  SYMPTOMS TO REPORT IMMEDIATELY:   Following lower endoscopy (colonoscopy or flexible sigmoidoscopy):  Excessive amounts of blood in the stool  Significant tenderness or worsening of abdominal pains  Swelling of the abdomen that is new, acute  Fever of 100F or higher  For urgent or emergent issues, a gastroenterologist can be reached at any hour by calling [phone]. Do not use MyChart messaging for urgent concerns.    DIET:  We do recommend a small meal at first, but then you may proceed to your regular diet.  Drink plenty of fluids but you should avoid alcoholic beverages for 24 hours.  ACTIVITY:  You should plan to take it easy for the rest of today and you should NOT DRIVE or use heavy machinery until tomorrow (because  of the sedation medicines used during the test).    FOLLOW UP: Our staff will call the number listed on your records 48-72 hours following your procedure to check on you and address any questions or concerns that you may have regarding the information given to you following your procedure. If we do not reach you, we will leave a message.  We will attempt to reach you two times.  During this call, we will ask if you have developed any symptoms of COVID 19. If you develop any symptoms (ie: fever, flu-like symptoms, shortness of breath, cough etc.) before then, please call [phone].  If you test positive for Covid 19 in the 2 weeks post procedure, please call and report this information to us.    If any biopsies were taken you will be contacted by phone or by letter within the next 1-3 weeks.  Please call us at [phone] if you have not heard about the biopsies in 3 weeks.    SIGNATURES/CONFIDENTIALITY: You and/or your care partner have signed paperwork which will be entered into your electronic medical record.  These signatures attest to the fact that that the information above on your After Visit Summary has been reviewed and is understood.  Full responsibility of the confidentiality of this discharge information lies with you and/or your care-partner. 

## 2020-08-08 NOTE — Anesthesia Postprocedure Evaluation (Signed)
Anesthesia Post Note  Patient: Sharon Shields  Procedure(s) Performed: COLONOSCOPY WITH PROPOFOL (N/A ) POLYPECTOMY SUBMUCOSAL LIFTING INJECTION     Patient location during evaluation: PACU Anesthesia Type: MAC Level of consciousness: awake and alert Pain management: pain level controlled Vital Signs Assessment: post-procedure vital signs reviewed and stable Respiratory status: spontaneous breathing, nonlabored ventilation, respiratory function stable and patient connected to nasal cannula oxygen Cardiovascular status: stable and blood pressure returned to baseline Postop Assessment: no apparent nausea or vomiting Anesthetic complications: no   No complications documented.  Last Vitals:  Vitals:   08/08/20 0845 08/08/20 0854  BP: 117/64 115/81  Pulse: 75 73  Resp: 18 15  Temp: 36.6 C   SpO2: 100% 100%    Last Pain:  Vitals:   08/08/20 0854  TempSrc:   PainSc: 0-No pain                 Willine Schwalbe S

## 2020-08-08 NOTE — Op Note (Signed)
[facility] Patient Name: Sharon Shields Procedure Date: 08/08/2020 MRN: [id number] Attending MD: Milus Banister , MD Date of Birth: 21-May-1950 CSN: [id number] Age: 70 Admit Type: Outpatient Procedure:                Colonoscopy Indications:              High risk colon cancer surveillance: Personal                            history of colonic polyps Providers:                Milus Banister, MD, Cleda Daub, RN, Nelia Shi, RN, Laverda Sorenson, Technician, Hedy Camara Referring MD:              Medicines:                Monitored Anesthesia Care Complications:            No immediate complications. Estimated blood loss:                            None. Estimated Blood Loss:     Estimated blood loss: none. Procedure:                Pre-Anesthesia Assessment:                           - Prior to the procedure, a History and Physical                            was performed, and patient medications and                            allergies were reviewed. The patient's tolerance of                            previous anesthesia was also reviewed. The risks                            and benefits of the procedure and the sedation                            options and risks were discussed with the patient.                            All questions were answered, and informed consent                            was obtained. Prior Anticoagulants: The patient has                            taken no previous anticoagulant or antiplatelet  agents. ASA Grade Assessment: II - A patient with                            mild systemic disease. After reviewing the risks                            and benefits, the patient was deemed in                            satisfactory condition to undergo the procedure.                           After obtaining informed consent, the colonoscope                             was passed under direct vision. Throughout the                            procedure, the patient's blood pressure, pulse, and                            oxygen saturations were monitored continuously. The                            CF-HQ190L ([id number] Olympus colonoscope was                            introduced through the anus and advanced to the the                            cecum, identified by appendiceal orifice and                            ileocecal valve. The colonoscopy was performed                            without difficulty. The patient tolerated the                            procedure well. The quality of the bowel                            preparation was good. The ileocecal valve,                            appendiceal orifice, and rectum were photographed. Scope In: 8:07:36 AM Scope Out: 8:41:18 AM Scope Withdrawal Time: 0 hours 29 minutes 42 seconds  Total Procedure Duration: 0 hours 33 minutes 42 seconds  Findings:      Three sessile polyps were found in the cecum. The polyps were 2 to 15 mm       in size. These polyps were all removed with cold snare, the largest       (9mm) requiring piecemeal resection. Jar 1      A 20 mm polyp was  found in the hepatic flexure. The polyp was sessile.       The polyp was removed with an Orise submucosal injection-lift technique       using a cold snare, piecemeal. Resection and retrieval were complete.       jar 2      Two heaped up polyps were found in the transverse, descending segments.       These measured 7-73mm across, were removed with cold snare. jar 3      Minor left sided diverticulosis.      The exam was otherwise without abnormality on direct and retroflexion       views. Impression:               - Three sessile polyps were found in the cecum. The                            polyps were 2 to 15 mm in size. These polyps were                            all removed with cold snare, the largest  (30mm)                            requiring piecemeal resection. Jar 1                           - A 20 mm polyp was found in the hepatic flexure.                            The polyp was sessile. The polyp was removed with                            an Orise submucosal injection-lift technique using                            a cold snare, piecemeal. Resection and retrieval                            were complete. jar 2                           - Two heaped up polyps were found in the                            transverse, descending segments. These measured                            7-49mm across, were removed with cold snare. jar 3                           - Minor left sided diverticulosis.                           - The examination was otherwise normal on direct  and retroflexion views. Moderate Sedation:      Not Applicable - Patient had care per Anesthesia. Recommendation:           - Patient has a contact number available for                            emergencies. The signs and symptoms of potential                            delayed complications were discussed with the                            patient. Return to normal activities tomorrow.                            Written discharge instructions were provided to the                            patient.                           - Resume previous diet.                           - Continue present medications.                           - Await pathology results. Procedure Code(s):        --- Professional ---                           [phone], Colonoscopy, flexible; with removal of                            tumor(s), polyp(s), or other lesion(s) by snare                            technique                           [id number], Colonoscopy, flexible; with directed                            submucosal injection(s), any substance Diagnosis Code(s):        --- Professional ---                            K63.5, Polyp of colon                           Z86.010, Personal history of colonic polyps CPT copyright 2019 [employer]. All rights reserved. The codes documented in this report are preliminary and upon coder review may  be revised to meet current compliance requirements. Milus Banister, MD 08/08/2020 8:52:22 AM This report has been signed electronically. Number of Addenda: 0

## 2020-08-09 ENCOUNTER — Encounter (HOSPITAL_COMMUNITY): Payer: Self-pay | Admitting: Gastroenterology

## 2020-08-09 LAB — SURGICAL PATHOLOGY

## 2020-12-18 ENCOUNTER — Telehealth: Payer: Self-pay | Admitting: Gastroenterology

## 2020-12-18 ENCOUNTER — Encounter: Payer: Self-pay | Admitting: Gastroenterology

## 2020-12-18 NOTE — Telephone Encounter (Signed)
The pt has been advised that she is ok to have procedure in the [facility] as scheduled.  See note below.   Sharon Shields, see below she needs a colonoscopy in 6 months.  LEC should beappropriate.  Thank you       Manju, The polyps which I removed were all typical appearing polyps.  No sign of any early changes towards cancer called high-grade dysplasia.  I would like you to have a repeat colonoscopy in 6 months.  If things look good at that point then we can start spacing them out by 3 to 5 years or so.  My office will helpcoordinate.  Thank you

## 2020-12-18 NOTE — Telephone Encounter (Signed)
Inbound call from patient to schedule recall colon. I scheduled in [place] but patient states she believe should be at [facility]. Could you confirm?

## 2021-02-10 ENCOUNTER — Other Ambulatory Visit: Payer: Self-pay | Admitting: Orthopedic Surgery

## 2021-02-10 DIAGNOSIS — M75101 Unspecified rotator cuff tear or rupture of right shoulder, not specified as traumatic: Secondary | ICD-10-CM

## 2021-02-20 ENCOUNTER — Ambulatory Visit
Admission: RE | Admit: 2021-02-20 | Discharge: 2021-02-20 | Disposition: A | Discharge status: Home / Self Care | Payer: Medicare Other | Source: Ambulatory Visit | Attending: Orthopedic Surgery | Admitting: Orthopedic Surgery

## 2021-02-20 ENCOUNTER — Other Ambulatory Visit: Payer: Self-pay

## 2021-02-20 DIAGNOSIS — M75101 Unspecified rotator cuff tear or rupture of right shoulder, not specified as traumatic: Secondary | ICD-10-CM | POA: Insufficient documentation

## 2021-02-21 ENCOUNTER — Ambulatory Visit (AMBULATORY_SURGERY_CENTER): Payer: Medicare Other

## 2021-02-21 ENCOUNTER — Other Ambulatory Visit: Payer: Self-pay

## 2021-02-21 VITALS — Ht 66.0 in | Wt 141.0 lb

## 2021-02-21 DIAGNOSIS — Z86010 Personal history of colonic polyps: Secondary | ICD-10-CM

## 2021-02-21 NOTE — Progress Notes (Signed)
Pre visit completed via phone call; Patient verified name, DOB, and address; No egg or soy allergy known to patient  No issues known to pt with past sedation with any surgeries or procedures Patient denies ever being told they had issues or difficulty with intubation  No FH of Malignant Hyperthermia Pt is not on diet pills Pt is not on home 02  Pt is not on blood thinners  Pt denies issues with constipation  No A fib or A flutter Pt is fully vaccinated for Covid x 2 + booster; NO PA's for preps discussed with pt in [facility] today  Discussed with pt there will be an out-of-pocket cost for prep and that varies from $0 to 70 +  dollars - pt verbalized understanding  Due to the COVID-19 pandemic we are asking patients to follow certain guidelines in [place] and the [place]   Pt aware of COVID protocols and [place] guidelines    Patient has requested to have instructions be mailed to a different address due to not having a physical mailbox at her home= [place] [address]

## 2021-03-07 ENCOUNTER — Ambulatory Visit (AMBULATORY_SURGERY_CENTER): Payer: Medicare Other | Admitting: Gastroenterology

## 2021-03-07 ENCOUNTER — Encounter: Payer: Self-pay | Admitting: Gastroenterology

## 2021-03-07 ENCOUNTER — Other Ambulatory Visit: Payer: Self-pay

## 2021-03-07 VITALS — BP 166/77 | HR 70 | Temp 99.1°F | Resp 15 | Ht 66.0 in | Wt 141.0 lb

## 2021-03-07 DIAGNOSIS — Z86010 Personal history of colonic polyps: Secondary | ICD-10-CM | POA: Diagnosis not present

## 2021-03-07 DIAGNOSIS — K635 Polyp of colon: Secondary | ICD-10-CM

## 2021-03-07 DIAGNOSIS — K621 Rectal polyp: Secondary | ICD-10-CM | POA: Diagnosis not present

## 2021-03-07 DIAGNOSIS — D125 Benign neoplasm of sigmoid colon: Secondary | ICD-10-CM

## 2021-03-07 DIAGNOSIS — D122 Benign neoplasm of ascending colon: Secondary | ICD-10-CM

## 2021-03-07 DIAGNOSIS — D127 Benign neoplasm of rectosigmoid junction: Secondary | ICD-10-CM | POA: Diagnosis not present

## 2021-03-07 DIAGNOSIS — D128 Benign neoplasm of rectum: Secondary | ICD-10-CM

## 2021-03-07 MED ORDER — SODIUM CHLORIDE 0.9 % IV SOLN: 500.0000 mL | Freq: Once | INTRAVENOUS | Status: AC

## 2021-03-07 NOTE — Progress Notes (Signed)
Pt's states no medical or surgical changes since previsit or office visit. VS assessed by D.T 

## 2021-03-07 NOTE — Progress Notes (Signed)
A and O x3. Report to RN. Tolerated MAC anesthesia well. 

## 2021-03-07 NOTE — Op Note (Signed)
[facility] Patient Name: Sharon Shields Procedure Date: 03/07/2021 2:13 PM MRN: [id number] Endoscopist: Milus Banister , MD Age: 70 Referring MD:  Date of Birth: 09/01/1950 Gender: Female Account #: [id number] Procedure:                Colonoscopy Indications:              High risk colon cancer surveillance: Personal                            history of colonic polyps: multiple adenomatous                            polyps and SSPs removed during Colonoscopy 06/2020                            and then also Colonoscopy 07/2020 Medicines:                Monitored Anesthesia Care Procedure:                Pre-Anesthesia Assessment:                           - Prior to the procedure, a History and Physical                            was performed, and patient medications and                            allergies were reviewed. The patient's tolerance of                            previous anesthesia was also reviewed. The risks                            and benefits of the procedure and the sedation                            options and risks were discussed with the patient.                            All questions were answered, and informed consent                            was obtained. Prior Anticoagulants: The patient has                            taken no previous anticoagulant or antiplatelet                            agents. ASA Grade Assessment: II - A patient with                            mild systemic disease. After reviewing the risks  and benefits, the patient was deemed in                            satisfactory condition to undergo the procedure.                           After obtaining informed consent, the colonoscope                            was passed under direct vision. Throughout the                            procedure, the patient's blood pressure, pulse, and                            oxygen saturations were  monitored continuously. The                            Olympus PCF-[id number]) Colonoscope was                            introduced through the anus and advanced to the the                            cecum, identified by appendiceal orifice and                            ileocecal valve. The colonoscopy was performed                            without difficulty. The patient tolerated the                            procedure well. The quality of the bowel                            preparation was good. Scope In: 2:17:17 PM Scope Out: 2:34:40 PM Scope Withdrawal Time: 0 hours 13 minutes 42 seconds  Total Procedure Duration: 0 hours 17 minutes 23 seconds  Findings:                 Four sessile polyps were found in the rectum,                            sigmoid colon and ascending colon. The polyps were                            3 to 9 mm in size. These polyps were removed with a                            cold snare. Resection and retrieval were complete.                           Multiple small and large-mouthed diverticula were  found in the left colon.                           The exam was otherwise without abnormality on                            direct and retroflexion views. Complications:            No immediate complications. Estimated blood loss:                            None. Estimated Blood Loss:     Estimated blood loss: none. Impression:               - Four 3 to 9 mm polyps in the rectum, in the                            sigmoid colon and in the ascending colon, removed                            with a cold snare. Resected and retrieved.                           - Diverticulosis in the left colon.                           - The examination was otherwise normal on direct                            and retroflexion views. Recommendation:           - Patient has a contact number available for                            emergencies. The  signs and symptoms of potential                            delayed complications were discussed with the                            patient. Return to normal activities tomorrow.                            Written discharge instructions were provided to the                            patient.                           - Resume previous diet.                           - Continue present medications.                           - Await pathology results. Milus Banister, MD 03/07/2021 2:37:41 PM This report has  been signed electronically.

## 2021-03-07 NOTE — Progress Notes (Signed)
HPI: This is a woman with multiple adenomatous polyps and SSPs removed 06/2020 and then also 07/2020   ROS: complete GI ROS as described in HPI, all other review negative.  Constitutional:  No unintentional weight loss   Past Medical History:  Diagnosis Date   Allergy    seasonal allergies   Arthritis    fingers/toes/shoulder   Hypertension    on meds   Sleep apnea    uses CPAP   Thyroid disease    on meds    Past Surgical History:  Procedure Laterality Date   COLONOSCOPY WITH PROPOFOL N/A 08/08/2020   Procedure: COLONOSCOPY WITH PROPOFOL;  Surgeon: Milus Banister, MD;  Location: [facility];  Service: Endoscopy;  Laterality: N/A;   DENTAL SURGERY     POLYPECTOMY  08/08/2020   Procedure: POLYPECTOMY;  Surgeon: Milus Banister, MD;  Location: [facility];  Service: Endoscopy;;   SUBMUCOSAL LIFTING INJECTION  08/08/2020   Procedure: SUBMUCOSAL LIFTING INJECTION;  Surgeon: Milus Banister, MD;  Location: [facility];  Service: Endoscopy;;    Current Outpatient Medications  Medication Sig Dispense Refill   aspirin EC 81 MG tablet Take 81 mg by mouth daily. Swallow whole.     buPROPion (WELLBUTRIN XL) 150 MG 24 hr tablet Take 150 mg by mouth every morning.     Chromium Picolinate 200 MCG CAPS Take 200 mcg by mouth daily.     CINNAMON PO Take 1,000 mg by mouth daily.     clonazePAM (KLONOPIN) 1 MG tablet Take 1 mg by mouth at bedtime.     GLUCOSAMINE-CHONDROITIN PO Take 2 tablets by mouth daily. With MSM     guaifenesin (HUMIBID E) 400 MG TABS tablet Take 400 mg by mouth daily as needed (Congestion).     liothyronine (CYTOMEL) 5 MCG tablet Take 10 mcg by mouth daily.     lisinopril-hydrochlorothiazide (ZESTORETIC) 20-25 MG tablet Take 1 tablet by mouth daily.     magnesium oxide (MAG-OX) 400 MG tablet Take 400 mg by mouth daily.     meloxicam (MOBIC) 15 MG tablet Take 15 mg by mouth daily as needed.     Methylcobalamin (METHYL B-12 PO) Take 1 tablet by mouth daily.      MILK THISTLE PO Take 1,000 mg by mouth daily.     Misc Natural Products (ADRENAL PO) Take 1 tablet by mouth daily. Response     Naltrexone POWD 1 capsule by Does not apply route daily at 6 (six) AM. Taking for inflammation     NP THYROID 60 MG tablet Take 120 mg by mouth daily.     Omega-3 Fatty Acids (FISH OIL PO) Take 1 capsule by mouth daily at 6 (six) AM.     OVER THE COUNTER MEDICATION Take 2 capsules by mouth daily. Emotional Wellness     OVER THE COUNTER MEDICATION Take 1 capsule by mouth daily. C/Lysine plus     PRESCRIPTION MEDICATION Take 100 mg by mouth daily. Compounded at [employer] Progesterone 100 mg SR     PRESCRIPTION MEDICATION Apply 1 mL topically daily. Estragon \ Testerone 60/40     selenium 200 MCG TABS tablet Take 200 mcg by mouth daily.     TURMERIC CURCUMIN PO Take 1 capsule by mouth in the morning and at bedtime.     VITAMIN D-VITAMIN K PO Take 1 tablet by mouth 2 (two) times a week. 500 + k     VYVANSE 20 MG capsule Take 20 mg by mouth every  other day.     Lifitegrast (XIIDRA) 5 % SOLN Place 1 drop into both eyes daily as needed. (Patient not taking: Reported on 03/07/2021)     loratadine (CLARITIN) 10 MG tablet Take 10 mg by mouth daily as needed. (Patient not taking: Reported on 03/07/2021)     Current Facility-Administered Medications  Medication Dose Route Frequency Provider Last Rate Last Admin   0.9 %  sodium chloride infusion  500 mL Intravenous Once Milus Banister, MD        Allergies as of 03/07/2021 - Review Complete 03/07/2021  Allergen Reaction Noted   Erythromycin Other (See Comments) 11/27/2018   Penicillins Other (See Comments) 11/27/2018    Family History  Problem Relation Age of Onset   Colon cancer Mother 59   Colon polyps Mother 45   Cervical cancer Mother    Colon cancer Father 8   Colon polyps Father 92   Esophageal cancer Neg Hx    Rectal cancer Neg Hx    Stomach cancer Neg Hx     Social History   Socioeconomic  History   Marital status: Single    Spouse name: Not on file   Number of children: Not on file   Years of education: Not on file   Highest education level: Not on file  Occupational History   Not on file  Tobacco Use   Smoking status: Every Day    Packs/day: 1.00    Years: 52.00    Pack years: 52.00    Types: Cigarettes   Smokeless tobacco: Never  Vaping Use   Vaping Use: Never used  Substance and Sexual Activity   Alcohol use: Yes    Alcohol/week: 14.0 standard drinks    Types: 14 Standard drinks or equivalent per week   Drug use: Never   Sexual activity: Not on file  Other Topics Concern   Not on file  Social History Narrative   Not on file   Social Determinants of Health   Financial Resource Strain: Not on file  Food Insecurity: Not on file  Transportation Needs: Not on file  Physical Activity: Not on file  Stress: Not on file  Social Connections: Not on file  Intimate Partner Violence: Not on file     Physical Exam: BP (!) 151/88   Pulse 95   Temp 99.1 F (37.3 C) (Skin)   Ht 5\' 6"  (1.676 m)   Wt 141 lb (64 kg)   SpO2 96%   BMI 22.76 kg/m  Constitutional: generally well-appearing Psychiatric: alert and oriented x3 Lungs: CTA bilaterally Heart: no MCR  Assessment and plan: 70 y.o. female with h/o polyps  Colonoscopy today  Care is appropriate for the ambulatory setting.  Owens Loffler, MD [facility] 03/07/2021, 2:09 PM

## 2021-03-07 NOTE — Patient Instructions (Signed)
YOU HAD AN ENDOSCOPIC PROCEDURE TODAY AT THE [facility]:   Refer to the procedure report that was given to you for any specific questions about what was found during the examination.  If the procedure report does not answer your questions, please call your gastroenterologist to clarify.  If you requested that your care partner not be given the details of your procedure findings, then the procedure report has been included in a sealed envelope for you to review at your convenience later.  YOU SHOULD EXPECT: Some feelings of bloating in the abdomen. Passage of more gas than usual.  Walking can help get rid of the air that was put into your GI tract during the procedure and reduce the bloating. If you had a lower endoscopy (such as a colonoscopy or flexible sigmoidoscopy) you may notice spotting of blood in your stool or on the toilet paper. If you underwent a bowel prep for your procedure, you may not have a normal bowel movement for a few days.  Please Note:  You might notice some irritation and congestion in your nose or some drainage.  This is from the oxygen used during your procedure.  There is no need for concern and it should clear up in a day or so.  SYMPTOMS TO REPORT IMMEDIATELY:  Following lower endoscopy (colonoscopy or flexible sigmoidoscopy):  Excessive amounts of blood in the stool  Significant tenderness or worsening of abdominal pains  Swelling of the abdomen that is new, acute  Fever of 100F or higher   For urgent or emergent issues, a gastroenterologist can be reached at any hour by calling [phone]. Do not use MyChart messaging for urgent concerns.    DIET:  We do recommend a small meal at first, but then you may proceed to your regular diet.  Drink plenty of fluids but you should avoid alcoholic beverages for 24 hours.  MEDICATIONS:  Continue present medications.  Please see handouts given to you by your recovery nurse.  Thank you for allowing us to  provide for your healthcare needs today.  ACTIVITY:  You should plan to take it easy for the rest of today and you should NOT DRIVE or use heavy machinery until tomorrow (because of the sedation medicines used during the test).    FOLLOW UP: Our staff will call the number listed on your records 48-72 hours following your procedure to check on you and address any questions or concerns that you may have regarding the information given to you following your procedure. If we do not reach you, we will leave a message.  We will attempt to reach you two times.  During this call, we will ask if you have developed any symptoms of COVID 19. If you develop any symptoms (ie: fever, flu-like symptoms, shortness of breath, cough etc.) before then, please call [phone].  If you test positive for Covid 19 in the 2 weeks post procedure, please call and report this information to us.    If any biopsies were taken you will be contacted by phone or by letter within the next 1-3 weeks.  Please call us at [phone] if you have not heard about the biopsies in 3 weeks.    SIGNATURES/CONFIDENTIALITY: You and/or your care partner have signed paperwork which will be entered into your electronic medical record.  These signatures attest to the fact that that the information above on your After Visit Summary has been reviewed and is understood.  Full responsibility of the   confidentiality of this discharge information lies with you and/or your care-partner.  

## 2021-03-11 ENCOUNTER — Telehealth: Payer: Self-pay

## 2021-03-11 NOTE — Telephone Encounter (Signed)
  Follow up Call-  Call back number 03/07/2021 06/17/2020  Post procedure Call Back phone  # [phone]  Permission to leave phone message Yes Yes  Some recent data might be hidden     Patient questions:  Do you have a fever, pain , or abdominal swelling? No. Pain Score  0 *  Have you tolerated food without any problems? Yes.    Have you been able to return to your normal activities? Yes.    Do you have any questions about your discharge instructions: Diet   No. Medications  No. Follow up visit  No.  Do you have questions or concerns about your Care? No.  Actions: * If pain score is 4 or above: No action needed, pain <4.

## 2021-03-13 ENCOUNTER — Encounter: Payer: Self-pay | Admitting: Gastroenterology

## 2021-03-14 ENCOUNTER — Other Ambulatory Visit: Payer: Self-pay | Admitting: Orthopedic Surgery

## 2021-03-14 DIAGNOSIS — M779 Enthesopathy, unspecified: Secondary | ICD-10-CM

## 2021-03-14 DIAGNOSIS — R937 Abnormal findings on diagnostic imaging of other parts of musculoskeletal system: Secondary | ICD-10-CM

## 2021-03-14 DIAGNOSIS — C419 Malignant neoplasm of bone and articular cartilage, unspecified: Secondary | ICD-10-CM

## 2021-03-20 ENCOUNTER — Other Ambulatory Visit: Payer: Self-pay | Admitting: Orthopedic Surgery

## 2021-03-31 ENCOUNTER — Other Ambulatory Visit
Admission: RE | Admit: 2021-03-31 | Discharge: 2021-03-31 | Disposition: A | Discharge status: Home / Self Care | Payer: Medicare Other | Source: Ambulatory Visit | Attending: *Deleted | Admitting: *Deleted

## 2021-03-31 ENCOUNTER — Encounter
Admission: RE | Admit: 2021-03-31 | Discharge: 2021-03-31 | Disposition: A | Discharge status: Home / Self Care | Payer: Medicare Other | Source: Ambulatory Visit | Attending: Orthopedic Surgery | Admitting: Orthopedic Surgery

## 2021-03-31 DIAGNOSIS — R937 Abnormal findings on diagnostic imaging of other parts of musculoskeletal system: Secondary | ICD-10-CM | POA: Diagnosis not present

## 2021-03-31 DIAGNOSIS — M779 Enthesopathy, unspecified: Secondary | ICD-10-CM | POA: Diagnosis present

## 2021-03-31 DIAGNOSIS — C419 Malignant neoplasm of bone and articular cartilage, unspecified: Secondary | ICD-10-CM | POA: Insufficient documentation

## 2021-03-31 LAB — COMPREHENSIVE METABOLIC PANEL
ALT: 20 U/L (ref 0–44)
AST: 23 U/L (ref 15–41)
Albumin: 3.9 g/dL (ref 3.5–5.0)
Alkaline Phosphatase: 55 U/L (ref 38–126)
Anion gap: 9 (ref 5–15)
BUN: 11 mg/dL (ref 8–23)
CO2: 27 mmol/L (ref 22–32)
Calcium: 9.1 mg/dL (ref 8.9–10.3)
Chloride: 95 mmol/L — ABNORMAL LOW (ref 98–111)
Creatinine, Ser: 0.7 mg/dL (ref 0.44–1.00)
GFR, Estimated: >60 mL/min (ref >60)
Glucose, Bld: 104 mg/dL — ABNORMAL HIGH (ref 70–99)
Potassium: 3.5 mmol/L (ref 3.5–5.1)
Sodium: 131 mmol/L — ABNORMAL LOW (ref 135–145)
Total Bilirubin: 0.8 mg/dL (ref 0.3–1.2)
Total Protein: 7.3 g/dL (ref 6.5–8.1)

## 2021-03-31 LAB — CBC WITH DIFFERENTIAL/PLATELET
Abs Immature Granulocytes: 0.04 10*3/uL (ref 0.00–0.07)
Basophils Absolute: 0 10*3/uL (ref 0.0–0.1)
Basophils Relative: 0 %
Eosinophils Absolute: 0 10*3/uL (ref 0.0–0.5)
Eosinophils Relative: 1 %
HCT: 35.8 % — ABNORMAL LOW (ref 36.0–46.0)
Hemoglobin: 12.3 g/dL (ref 12.0–15.0)
Immature Granulocytes: 1 %
Lymphocytes Relative: 21 %
Lymphs Abs: 1.5 10*3/uL (ref 0.7–4.0)
MCH: 34.2 pg — ABNORMAL HIGH (ref 26.0–34.0)
MCHC: 34.4 g/dL (ref 30.0–36.0)
MCV: 99.4 fL (ref 80.0–100.0)
Monocytes Absolute: 0.7 10*3/uL (ref 0.1–1.0)
Monocytes Relative: 9 %
Neutro Abs: 4.9 10*3/uL (ref 1.7–7.7)
Neutrophils Relative %: 68 %
Platelets: 265 10*3/uL (ref 150–400)
RBC: 3.6 MIL/uL — ABNORMAL LOW (ref 3.87–5.11)
RDW: 12.3 % (ref 11.5–15.5)
WBC: 7.2 10*3/uL (ref 4.0–10.5)
nRBC: 0 % (ref 0.0–0.2)

## 2021-03-31 LAB — MAGNESIUM: Magnesium: 1.8 mg/dL (ref 1.7–2.4)

## 2021-03-31 LAB — LACTATE DEHYDROGENASE: LDH: 94 U/L — ABNORMAL LOW (ref 98–192)

## 2021-03-31 LAB — PHOSPHORUS: Phosphorus: 2.2 mg/dL — ABNORMAL LOW (ref 2.5–4.6)

## 2021-03-31 MED ORDER — TECHNETIUM TC 99M MEDRONATE IV KIT
20.0000 | PACK | Freq: Once | INTRAVENOUS | Status: AC | PRN
  Administered 2021-03-31: 21.67 via INTRAVENOUS

## 2021-04-01 LAB — BETA 2 MICROGLOBULIN, SERUM: Beta-2 Microglobulin: 1.6 mg/L (ref 0.6–2.4)

## 2021-04-02 LAB — PROTEIN ELECTROPHORESIS, SERUM
A/G Ratio: 1.3 (ref 0.7–1.7)
Albumin ELP: 3.6 g/dL (ref 2.9–4.4)
Alpha-1-Globulin: 0.3 g/dL (ref 0.0–0.4)
Alpha-2-Globulin: 0.7 g/dL (ref 0.4–1.0)
Beta Globulin: 0.9 g/dL (ref 0.7–1.3)
Gamma Globulin: 1 g/dL (ref 0.4–1.8)
Globulin, Total: 2.8 g/dL (ref 2.2–3.9)
Total Protein ELP: 6.4 g/dL (ref 6.0–8.5)

## 2021-04-03 LAB — IMMUNOFIXATION ELECTROPHORESIS
IgA: 277 mg/dL (ref 87–352)
IgG (Immunoglobin G), Serum: 967 mg/dL (ref 586–1602)
IgM (Immunoglobulin M), Srm: 74 mg/dL (ref 26–217)
Total Protein ELP: 6.8 g/dL (ref 6.0–8.5)
